# Patient Record
Sex: Female | Born: 1970 | Race: Black or African American | Hispanic: No | Marital: Married | Smoking: Never smoker
Health system: Southern US, Community
[De-identification: ages and names within clinical notes are randomized; demographics above are authoritative.]

## PROBLEM LIST (undated history)

## (undated) DIAGNOSIS — H409 Unspecified glaucoma: Secondary | ICD-10-CM

## (undated) DIAGNOSIS — H9192 Unspecified hearing loss, left ear: Secondary | ICD-10-CM

## (undated) HISTORY — PX: CHOLECYSTECTOMY: SHX55

## (undated) HISTORY — PX: OTHER SURGICAL HISTORY: SHX169

## (undated) HISTORY — DX: Unspecified glaucoma: H40.9

## (undated) HISTORY — DX: Unspecified hearing loss, left ear: H91.92

---

## 2021-12-31 ENCOUNTER — Other Ambulatory Visit: Payer: Self-pay | Admitting: Family Medicine

## 2021-12-31 DIAGNOSIS — Z1231 Encounter for screening mammogram for malignant neoplasm of breast: Secondary | ICD-10-CM

## 2022-02-11 ENCOUNTER — Ambulatory Visit
Admission: RE | Admit: 2022-02-11 | Discharge: 2022-02-11 | Disposition: A | Discharge status: Home / Self Care | Payer: 59 | Source: Ambulatory Visit | Attending: Family Medicine | Admitting: Family Medicine

## 2022-02-11 DIAGNOSIS — Z1231 Encounter for screening mammogram for malignant neoplasm of breast: Secondary | ICD-10-CM

## 2022-05-03 ENCOUNTER — Emergency Department (HOSPITAL_BASED_OUTPATIENT_CLINIC_OR_DEPARTMENT_OTHER)
Admission: EM | Admit: 2022-05-03 | Discharge: 2022-05-03 | Disposition: A | Discharge status: Home / Self Care | Payer: BC Managed Care – PPO | Attending: Emergency Medicine | Admitting: Emergency Medicine

## 2022-05-03 ENCOUNTER — Other Ambulatory Visit: Payer: Self-pay

## 2022-05-03 ENCOUNTER — Emergency Department (HOSPITAL_BASED_OUTPATIENT_CLINIC_OR_DEPARTMENT_OTHER): Payer: BC Managed Care – PPO

## 2022-05-03 DIAGNOSIS — R55 Syncope and collapse: Secondary | ICD-10-CM | POA: Insufficient documentation

## 2022-05-03 DIAGNOSIS — R2990 Unspecified symptoms and signs involving the nervous system: Secondary | ICD-10-CM | POA: Diagnosis not present

## 2022-05-03 DIAGNOSIS — G43109 Migraine with aura, not intractable, without status migrainosus: Secondary | ICD-10-CM

## 2022-05-03 DIAGNOSIS — G43909 Migraine, unspecified, not intractable, without status migrainosus: Secondary | ICD-10-CM | POA: Diagnosis present

## 2022-05-03 LAB — DIFFERENTIAL
Abs Immature Granulocytes: 0.02 10*3/uL (ref 0.00–0.07)
Basophils Absolute: 0 10*3/uL (ref 0.0–0.1)
Basophils Relative: 1 %
Eosinophils Absolute: 0.1 10*3/uL (ref 0.0–0.5)
Eosinophils Relative: 1 %
Immature Granulocytes: 0 %
Lymphocytes Relative: 45 %
Lymphs Abs: 3.2 10*3/uL (ref 0.7–4.0)
Monocytes Absolute: 0.4 10*3/uL (ref 0.1–1.0)
Monocytes Relative: 6 %
Neutro Abs: 3.3 10*3/uL (ref 1.7–7.7)
Neutrophils Relative %: 47 %

## 2022-05-03 LAB — COMPREHENSIVE METABOLIC PANEL
ALT: 12 U/L (ref 0–44)
AST: 28 U/L (ref 15–41)
Albumin: 4.6 g/dL (ref 3.5–5.0)
Alkaline Phosphatase: 68 U/L (ref 38–126)
Anion gap: 9 (ref 5–15)
BUN: 12 mg/dL (ref 6–20)
CO2: 24 mmol/L (ref 22–32)
Calcium: 9.9 mg/dL (ref 8.9–10.3)
Chloride: 106 mmol/L (ref 98–111)
Creatinine, Ser: 0.68 mg/dL (ref 0.44–1.00)
GFR, Estimated: >60 mL/min (ref >60)
Glucose, Bld: 85 mg/dL (ref 70–99)
Potassium: 4.7 mmol/L (ref 3.5–5.1)
Sodium: 139 mmol/L (ref 135–145)
Total Bilirubin: 0.4 mg/dL (ref 0.3–1.2)
Total Protein: 8.1 g/dL (ref 6.5–8.1)

## 2022-05-03 LAB — CBC
HCT: 41.9 % (ref 36.0–46.0)
Hemoglobin: 13.4 g/dL (ref 12.0–15.0)
MCH: 28.9 pg (ref 26.0–34.0)
MCHC: 32 g/dL (ref 30.0–36.0)
MCV: 90.3 fL (ref 80.0–100.0)
Platelets: 239 10*3/uL (ref 150–400)
RBC: 4.64 MIL/uL (ref 3.87–5.11)
RDW: 13.2 % (ref 11.5–15.5)
WBC: 7.1 10*3/uL (ref 4.0–10.5)
nRBC: 0 % (ref 0.0–0.2)

## 2022-05-03 LAB — ETHANOL: Alcohol, Ethyl (B): 15 mg/dL — ABNORMAL HIGH (ref <10)

## 2022-05-03 LAB — APTT: aPTT: 35 seconds (ref 24–36)

## 2022-05-03 LAB — PROTIME-INR
INR: 1 (ref 0.8–1.2)
Prothrombin Time: 12.8 seconds (ref 11.4–15.2)

## 2022-05-03 LAB — CBG MONITORING, ED
Glucose-Capillary: 52 mg/dL — ABNORMAL LOW (ref 70–99)
Glucose-Capillary: 131 mg/dL — ABNORMAL HIGH (ref 70–99)

## 2022-05-03 MED ORDER — SODIUM CHLORIDE 0.9 % IV SOLN: INTRAVENOUS | Status: DC

## 2022-05-03 MED ORDER — KETOROLAC TROMETHAMINE 15 MG/ML IJ SOLN
15.0000 mg | Freq: Four times a day (QID) | INTRAMUSCULAR | Status: DC
  Administered 2022-05-03: 15 mg via INTRAVENOUS
  Filled 2022-05-03: qty 1

## 2022-05-03 MED ORDER — THIAMINE HCL 100 MG/ML IJ SOLN
100.0000 mg | Freq: Every day | INTRAMUSCULAR | Status: DC
  Administered 2022-05-03: 100 mg via INTRAVENOUS
  Filled 2022-05-03: qty 2

## 2022-05-03 MED ORDER — MAGNESIUM SULFATE 2 GM/50ML IV SOLN
2.0000 g | Freq: Once | INTRAVENOUS | Status: AC
Start: 2022-05-03 — End: 2022-05-03
  Administered 2022-05-03: 2 g via INTRAVENOUS
  Filled 2022-05-03: qty 50

## 2022-05-03 MED ORDER — METOCLOPRAMIDE HCL 5 MG/ML IJ SOLN: 10.0000 mg | Freq: Once | INTRAMUSCULAR | Status: DC

## 2022-05-03 MED ORDER — DEXTROSE 50 % IV SOLN
INTRAVENOUS | Status: AC
  Administered 2022-05-03: 50 mL via INTRAVENOUS
  Filled 2022-05-03: qty 50

## 2022-05-03 MED ORDER — PROCHLORPERAZINE EDISYLATE 10 MG/2ML IJ SOLN
5.0000 mg | Freq: Four times a day (QID) | INTRAMUSCULAR | Status: DC
  Administered 2022-05-03: 5 mg via INTRAVENOUS
  Filled 2022-05-03: qty 2

## 2022-05-03 MED ORDER — SODIUM CHLORIDE 0.9 % IV BOLUS
1000.0000 mL | Freq: Once | INTRAVENOUS | Status: AC
  Administered 2022-05-03: 1000 mL via INTRAVENOUS

## 2022-05-03 MED ORDER — DEXAMETHASONE SODIUM PHOSPHATE 10 MG/ML IJ SOLN: 10.0000 mg | Freq: Once | INTRAMUSCULAR | Status: DC

## 2022-05-03 MED ORDER — DIPHENHYDRAMINE HCL 50 MG/ML IJ SOLN: 25.0000 mg | Freq: Once | INTRAMUSCULAR | Status: DC

## 2022-05-03 MED ORDER — DEXTROSE 50 % IV SOLN
1.0000 | Freq: Once | INTRAVENOUS | Status: AC
Start: 2022-05-03 — End: 2022-05-03

## 2022-05-03 MED ORDER — DIPHENHYDRAMINE HCL 50 MG/ML IJ SOLN
12.5000 mg | Freq: Four times a day (QID) | INTRAMUSCULAR | Status: DC
  Administered 2022-05-03: 12.5 mg via INTRAVENOUS
  Filled 2022-05-03: qty 1

## 2022-05-03 NOTE — ED Notes (Signed)
Pt became more alert after AMP of d-50 given

## 2022-05-03 NOTE — ED Notes (Signed)
Code stroke called by Dr. Deretha Emory, Telestroke notified

## 2022-05-04 LAB — VITAMIN B12: Vitamin B-12: 1462 pg/mL — ABNORMAL HIGH (ref 180–914)

## 2022-05-06 LAB — VITAMIN B1: Vitamin B1 (Thiamine): 71.4 nmol/L (ref 66.5–200.0)

## 2023-02-28 ENCOUNTER — Telehealth: Payer: Self-pay | Admitting: Neurology

## 2023-02-28 ENCOUNTER — Encounter: Payer: Self-pay | Admitting: Neurology

## 2023-02-28 ENCOUNTER — Ambulatory Visit: Payer: No Typology Code available for payment source | Admitting: Neurology

## 2023-02-28 VITALS — BP 117/70 | HR 66 | Ht 67.0 in | Wt 175.8 lb

## 2023-02-28 DIAGNOSIS — H409 Unspecified glaucoma: Secondary | ICD-10-CM | POA: Insufficient documentation

## 2023-02-28 DIAGNOSIS — G441 Vascular headache, not elsewhere classified: Secondary | ICD-10-CM | POA: Diagnosis not present

## 2023-02-28 DIAGNOSIS — G932 Benign intracranial hypertension: Secondary | ICD-10-CM | POA: Insufficient documentation

## 2023-02-28 DIAGNOSIS — G43709 Chronic migraine without aura, not intractable, without status migrainosus: Secondary | ICD-10-CM | POA: Diagnosis not present

## 2023-02-28 DIAGNOSIS — H4710 Unspecified papilledema: Secondary | ICD-10-CM

## 2023-02-28 DIAGNOSIS — R519 Headache, unspecified: Secondary | ICD-10-CM

## 2023-02-28 DIAGNOSIS — G4734 Idiopathic sleep related nonobstructive alveolar hypoventilation: Secondary | ICD-10-CM

## 2023-02-28 DIAGNOSIS — H539 Unspecified visual disturbance: Secondary | ICD-10-CM

## 2023-02-28 DIAGNOSIS — R51 Headache with orthostatic component, not elsewhere classified: Secondary | ICD-10-CM

## 2023-02-28 DIAGNOSIS — H5713 Ocular pain, bilateral: Secondary | ICD-10-CM

## 2023-02-28 MED ORDER — UBRELVY 100 MG PO TABS: 100.0000 mg | ORAL_TABLET | ORAL | 0 refills | Status: AC | PRN

## 2023-02-28 MED ORDER — FREMANEZUMAB-VFRM 225 MG/1.5ML ~~LOC~~ SOSY: 225.0000 mg | PREFILLED_SYRINGE | Freq: Once | SUBCUTANEOUS | Status: AC

## 2023-02-28 MED ORDER — AJOVY 225 MG/1.5ML ~~LOC~~ SOAJ: 225.0000 mg | SUBCUTANEOUS | 11 refills | Status: DC

## 2023-02-28 NOTE — Telephone Encounter (Signed)
Please send ONO overnight for patient for nocturnal hypoxemia she is not on oxygen and call and check up in a week make sure they sent it thanks

## 2023-02-28 NOTE — Patient Instructions (Addendum)
MRI brain and orbits Lumbar puncture Stay of the topiramate until we get this completed  Treat for Migraines: Ajovy Oxygenation monitor overnight  Ubrogepant Tablets What is this medication? UBROGEPANT (ue BROE je pant) treats migraines. It works by blocking a substance in the body that causes migraines. It is not used to prevent migraines. This medicine may be used for other purposes; ask your health care provider or pharmacist if you have questions. COMMON BRAND NAME(S): Bernita Raisin What should I tell my care team before I take this medication? They need to know if you have any of these conditions: Kidney disease Liver disease An unusual or allergic reaction to ubrogepant, other medications, foods, dyes, or preservatives Pregnant or trying to get pregnant Breast-feeding How should I use this medication? Take this medication by mouth with a glass of water. Take it as directed on the prescription label. You can take it with or without food. If it upsets your stomach, take it with food. Keep taking it unless your care team tells you to stop. Talk to your care team about the use of this medication in children. Special care may be needed. Overdosage: If you think you have taken too much of this medicine contact a poison control center or emergency room at once. NOTE: This medicine is only for you. Do not share this medicine with others. What if I miss a dose? This does not apply. This medication is not for regular use. What may interact with this medication? Do not take this medication with any of the following: Adagrasib Ceritinib Certain antibiotics, such as chloramphenicol, clarithromycin, telithromycin Certain antivirals for HIV, such as atazanavir, cobicistat, darunavir, delavirdine, fosamprenavir, indinavir, ritonavir Certain medications for fungal infections, such as itraconazole, ketoconazole, posaconazole,  voriconazole Conivaptan Grapefruit Idelalisib Mifepristone Nefazodone Ribociclib This medication may also interact with the following: Carvedilol Certain medications for seizures, such as phenobarbital, phenytoin Ciprofloxacin Cyclosporine Eltrombopag Fluconazole Fluvoxamine Quinidine Rifampin St. John's wort Verapamil This list may not describe all possible interactions. Give your health care provider a list of all the medicines, herbs, non-prescription drugs, or dietary supplements you use. Also tell them if you smoke, drink alcohol, or use illegal drugs. Some items may interact with your medicine. What should I watch for while using this medication? Visit your care team for regular checks on your progress. Tell your care team if your symptoms do not start to get better or if they get worse. Your mouth may get dry. Chewing sugarless gum or sucking hard candy and drinking plenty of water may help. Contact your care team if the problem does not go away or is severe. What side effects may I notice from receiving this medication? Side effects that you should report to your care team as soon as possible: Allergic reactions--skin rash, itching, hives, swelling of the face, lips, tongue, or throat Side effects that usually do not require medical attention (report to your care team if they continue or are bothersome): Drowsiness Dry mouth Fatigue Nausea This list may not describe all possible side effects. Call your doctor for medical advice about side effects. You may report side effects to FDA at 1-800-FDA-1088. Where should I keep my medication? Keep out of the reach of children and pets. Store between 15 and 30 degrees C (59 and 86 degrees F). Get rid of any unused medication after the expiration date. To get rid of medications that are no longer needed or have expired: Take the medication to a medication take-back program. Check with your pharmacy  or law enforcement to find a  location. If you cannot return the medication, check the label or package insert to see if the medication should be thrown out in the garbage or flushed down the toilet. If you are not sure, ask your care team. If it is safe to put it in the trash, pour the medication out of the container. Mix the medication with cat litter, dirt, coffee grounds, or other unwanted substance. Seal the mixture in a bag or container. Put it in the trash. NOTE: This sheet is a summary. It may not cover all possible information. If you have questions about this medicine, talk to your doctor, pharmacist, or health care provider.  2023 Elsevier/Gold Standard (2021-12-02 00:00:00)   Jill Browning Injection What is this medication? FREMANEZUMAB (fre ma NEZ ue mab) prevents migraines. It works by blocking a substance in the body that causes migraines. It is a monoclonal antibody. This medicine may be used for other purposes; ask your health care provider or pharmacist if you have questions. COMMON BRAND NAME(S): AJOVY What should I tell my care team before I take this medication? They need to know if you have any of these conditions: An unusual or allergic reaction to fremanezumab, other medications, foods, dyes, or preservatives Pregnant or trying to get pregnant Breast-feeding How should I use this medication? This medication is injected under the skin. You will be taught how to prepare and give it. Take it as directed on the prescription label. Keep taking it unless your care team tells you to stop. It is important that you put your used needles and syringes in a special sharps container. Do not put them in a trash can. If you do not have a sharps container, call your pharmacist or care team to get one. Talk to your care team about the use of this medication in children. Special care may be needed. Overdosage: If you think you have taken too much of this medicine contact a poison control center or emergency room at  once. NOTE: This medicine is only for you. Do not share this medicine with others. What if I miss a dose? If you miss a dose, take it as soon as you can. If it is almost time for your next dose, take only that dose. Do not take double or extra doses. What may interact with this medication? Interactions are not expected. This list may not describe all possible interactions. Give your health care provider a list of all the medicines, herbs, non-prescription drugs, or dietary supplements you use. Also tell them if you smoke, drink alcohol, or use illegal drugs. Some items may interact with your medicine. What should I watch for while using this medication? Tell your care team if your symptoms do not start to get better or if they get worse. What side effects may I notice from receiving this medication? Side effects that you should report to your care team as soon as possible: Allergic reactions or angioedema--skin rash, itching or hives, swelling of the face, eyes, lips, tongue, arms, or legs, trouble swallowing or breathing Side effects that usually do not require medical attention (report to your care team if they continue or are bothersome): Pain, redness, or irritation at injection site This list may not describe all possible side effects. Call your doctor for medical advice about side effects. You may report side effects to FDA at 1-800-FDA-1088. Where should I keep my medication? Keep out of the reach of children and pets. Store in a  refrigerator or at room temperature between 20 and 25 degrees C (68 and 77 degrees F). Refrigeration (preferred): Store in the refrigerator. Do not freeze. Keep in the original container until you are ready to take it. Remove the dose from the carton about 30 minutes before it is time for you to use it. If the dose is not used, it may be stored in the original container at room temperature for 7 days. Get rid of any unused medication after the expiration date. Room  Temperature: This medication may be stored at room temperature for up to 7 days. Keep it in the original container. Protect from light until time of use. If it is stored at room temperature, get rid of any unused medication after 7 days or after it expires, whichever is first. To get rid of medications that are no longer needed or have expired: Take the medication to a medication take-back program. Check with your pharmacy or law enforcement to find a location. If you cannot return the medication, ask your pharmacist or care team how to get rid of this medication safely. NOTE: This sheet is a summary. It may not cover all possible information. If you have questions about this medicine, talk to your doctor, pharmacist, or health care provider.  2023 Elsevier/Gold Standard (2021-12-14 00:00:00)

## 2023-02-28 NOTE — Telephone Encounter (Signed)
[employer] sent to [facility] they obtain auth [id number] 

## 2023-02-28 NOTE — Telephone Encounter (Signed)
Call to patient to advise that [facility] will be reaching out for ONO (overnight oxygen study). Explained what study would entail and patient in agreement and appreciative of call. Gave patient [facility] phone number and location and advised if she didn't hear from them in 1 week to call.

## 2023-02-28 NOTE — Progress Notes (Signed)
[facility]    Provider:  Dr Lucia Gaskins Requesting Provider: Caffie Damme, MD Primary Care Provider:  Caffie Damme, MD  CC:  migraines  HPI:  Jill Browning is a 52 y.o. female here as requested by Jill Damme, MD for pseudotumor cerebri.  Dr. Caffie Damme, MD from [facility].  I reviewed Dr. Michaelle Browning notes: Patient has a history of migraines, symptoms include headache, photophobia, phonophobia, nausea, and vomiting, no prodrome or aura or scotoma.  The headache is located in the left occipital area and right occipital area and the patient describes the pain as a viselike occurring daily, no new changes she was going to a neurologist and asked to be referred to a different.  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 LP in 02/25/2014 Opening pressure 17 cm water. Closing pressure 14 cm water with a "normal" MRV MRI MRI brain showed "Expanded sella and no pituitary mass. Optic nerve sheath complex is distended and tortuous on both sides. 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 "(report on "Care Everywhere). 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  Back in [place] dxed with glaucoma and was on acetazolamide. She sees dr. Prince Browning in [place] in [facility]. She was on Diamox for years prior to coming here from [place] and was changed by [facility] to Topiramate. Since she is here they are "intense" She has severe headaches, in the back of the head, not behind  the eyes, she has head pain that wakes her up at night, she wakes up with them in the morning, she doesn't snore, not excessively tired, no signs or symptoms of OSA but wakes up at least 3x out of the week. 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 Continuously daily headaches 24x7, migraines are at least 15 migraine days a month that moderate to severe can last all day. Topiramate was helping but not a whole lot. Not on it now. No other focal neurologic deficits, associated symptoms, inciting events or modifiable factors.   Reviewed notes, labs and imaging from outside physicians, which showed:  04/2022: [facility]" Patient ID: Jill Browning is a 52 y.o. female.  Presents for evaluation of headaches. Reports diagnosis of migraines in 2014 - reports chronic headaches, start posteriorly and radiate to the forehead, usually unilateral. Can see something moving beforehand, like squiggles in vision. Accompanied by blurred vision, neck pain, light and smell sensitivity. Worse with lying down (sleeps with head of bed elevated) as well as bending and straining. Reports since moving from [place], has increased to about 3 days a week - can last 1-2 hours. Takes tylenol without much benefit (not daily). 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 Was on acetazolamide for a while without significant benefit. "  EKG on December 09, 2022 showed a heart rate of 67  and a normal QT C of 422, sinus rhythm within normal limits, metabolic panel was normal including BUN 6 and creatinine is 0.61 CBC was also normal, hemoglobin A1c 5.1, HIV 1 and 2 nonreactive, thyroid was normal at 0.561, she is being referred to [facility] cardiology for her shortness of  breath, and coming here for her migraines and pseudotumor cerebri, she was started on pre-Maren vaginal cream recently for atrophic vaginitis.  Also see above for prior history from review of chart and referral.  Her magnesium is normal 2.1  There are also notes from [facility] from April 2022, it appears as though she has tried amitriptyline, Maxalt, Topamax, acetazolamide, Imitrex  CT head 05/03/2022: TECHNIQUE: Contiguous axial images were obtained from the base of the skull through the vertex without intravenous contrast.   RADIATION DOSE REDUCTION: This exam was performed according to the departmental dose-optimization program which includes automated exposure control, adjustment of the mA and/or kV according to patient size and/or use of iterative reconstruction technique.   COMPARISON:  None Available.   FINDINGS: Brain: Cerebral volume within normal limits for patient age.   No evidence for acute intracranial hemorrhage. No findings to suggest acute large vessel territory infarct. No mass lesion, midline shift, or mass effect. Ventricles are normal in size without evidence for hydrocephalus. No extra-axial fluid collection identified. Markedly empty sella.   Vascular: No hyperdense vessel identified.   Skull: Scalp soft tissues demonstrate no acute abnormality. Calvarium intact.   Sinuses/Orbits: Globes and orbital soft tissues within normal limits.   Visualized paranasal sinuses are clear. No mastoid effusion.   ASPECTS [facility] Stroke Program Early CT Score)   - Ganglionic level infarction (caudate, lentiform nuclei, internal capsule, insula, M1-M3 cortex): 7   - Supraganglionic infarction (M4-M6 cortex): 3   Total score (0-10 with 10 being normal): 10   IMPRESSION: 1. No acute intracranial abnormality. 2. ASPECTS is 10. 3. Markedly empty sella. While this finding can be incidental in nature and of no significance, this can also be seen in the  setting of idiopathic intracranial hypertension.  LP 2015: RESULT:  After explanation of risks, benefits, and alternatives, the patient was consented for the procedure and signed the consent form. The patient was then placed on the fluoroscopy  table in a prone, slightly obliqued position and the L3-L4 interspace was identified and marked. The site was then prepped and draped in a sterile fashion and 1% lidocaine was used  to anesthetize the site. A 5 inch 22-gauge spinal needle with stylet was introduced into the thecal sac and 10 mL of colorless, transparent fluid was collected and sent for further  laboratory analysis. Opening pressure was calculated at 17 cm water ( manometer reading of five on May 12 cm needle). Closing pressure was calculated at 14 cm water (manometer  reading of two on a 12 cm needle). The patient tolerated the procedure well and there were no initial complications. The patient was instructed to lay supine for 4 hours  immediately after the procedure.   IMPRESSION:  Successful fluoroscopically guided lumbar puncture. 10 mL of clear CSF fluid were obtained and sent for further laboratory review. Opening pressure 17 cm water. Closing pressure 14  cm water.   Review of Systems: Patient complains of symptoms per HPI as well as the following symptoms headache. Pertinent negatives and positives per HPI. All others negative.   Social History   Socioeconomic History   Marital status: Married    Spouse name: Not on  file   Number of children: Not on file   Years of education: Not on file   Highest education level: Not on file  Occupational History   Not on file  Tobacco Use   Smoking status: Never   Smokeless tobacco: Never  Substance and Sexual Activity   Alcohol use: Never   Drug use: Not on file   Sexual activity: Not on file  Other Topics Concern   Not on file  Social History Narrative   Not on file   Social Determinants of Health   Financial Resource  Strain: Not on file  Food Insecurity: Not on file  Transportation Needs: Not on file  Physical Activity: Not on file  Stress: Not on file  Social Connections: Not on file  Intimate Partner Violence: Not on file    Family History  Problem Relation Age of Onset   Migraines Neg Hx     Past Medical History:  Diagnosis Date   Deaf, left    Glaucoma     Patient Active Problem List   Diagnosis Date Noted   Chronic migraine without aura without status migrainosus, not intractable 02/28/2023   Glaucoma of both eyes 02/28/2023   History of IIH (idiopathic intracranial hypertension) 02/28/2023    Past Surgical History:  Procedure Laterality Date   CHOLECYSTECTOMY     gastric sleeve     hysterectomy     tubligation       Current Outpatient Medications  Medication Sig Dispense Refill   Acetaminophen (TYLENOL PO) Take by mouth.     Ascorbic Acid (VITAMIN C PO) Take by mouth.     brimonidine (ALPHAGAN) 0.2 % ophthalmic solution 1 drop 2 (two) times daily.     CALCIUM PO Take by mouth.     Fremanezumab-vfrm (AJOVY) 225 MG/1.5ML SOAJ Inject 225 mg into the skin every 30 (thirty) days. COPAY: BIN#: [id number] PCN#: PDMI GROUP#: [id number] ID#: [id number] EXPIRES: 11/07/2023 1.5 mL 11   latanoprost (XALATAN) 0.005 % ophthalmic solution Apply to eye.     Multiple Vitamin (MULTIVITAMIN PO) Take by mouth.     pantoprazole (PROTONIX) 40 MG tablet Take 40 mg by mouth daily.     timolol (TIMOPTIC) 0.5 % ophthalmic solution Apply to eye.     Ubrogepant (UBRELVY) 100 MG TABS Take 1 tablet (100 mg total) by mouth every 2 (two) hours as needed. Maximum 200mg  a day. 8 tablet 0   VITAMIN D PO Take by mouth.     Current Facility-Administered Medications  Medication Dose Route Frequency Provider Last Rate Last Admin   Fremanezumab-vfrm SOSY 225 mg  225 mg Subcutaneous Once Anson Fret, MD        Allergies as of 02/28/2023 - Review Complete 02/28/2023  Allergen Reaction Noted   Oxycodone  Anaphylaxis 05/03/2022    Vitals: BP 117/70   Pulse 66   Ht 5\' 7"  (1.702 m)   Wt 175 lb 12.8 oz (79.7 kg)   BMI 27.53 kg/m  Last Weight:  Wt Readings from Last 1 Encounters:  02/28/23 175 lb 12.8 oz (79.7 kg)   Last Height:   Ht Readings from Last 1 Encounters:  02/28/23 5\' 7"  (1.702 m)     Physical exam: Exam: Gen: NAD, conversant, well nourised, obese, well groomed                     CV: RRR, no MRG. No Carotid Bruits. No peripheral edema, warm, nontender Eyes: Conjunctivae clear without exudates or  hemorrhage  Neuro: Detailed Neurologic Exam  Speech:    Speech is normal; fluent and spontaneous with normal comprehension.  Cognition:    The patient is oriented to person, place, and time;     recent and remote memory intact;     language fluent;     normal attention, concentration,     fund of knowledge Cranial Nerves:    The pupils are equal, round, and reactive to light. ONH elevation? Visual fields are full to finger confrontation. Extraocular movements are intact. Trigeminal sensation is intact and the muscles of mastication are normal. The face is symmetric. The palate elevates in the midline. 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 Voice is normal. Shoulder shrug is normal. The tongue has normal motion without fasciculations.   Coordination:    Normal finger to nose and heel to shin. Normal rapid alternating movements.   Gait:    Heel-toe and tandem gait are normal.   Motor Observation:    No asymmetry, no atrophy, and no involuntary movements noted. Tone:    Normal muscle tone.    Posture:    Posture is normal. normal erect    Strength:    Strength is V/V in the upper and lower limbs.      Sensation: intact to LT     Reflex Exam:  DTR's:    Deep tendon reflexes in the upper and lower extremities are normal bilaterally.   Toes:    The toes are downgoing bilaterally.   Clonus:    Clonus is absent.    Assessment/Plan:  Patient with chronic migraine.  Was worked up in [place] and diagnosed with IDIOPATHIC INTRACRANIAL HYPERTENSION due to , MRI brain showed "Expanded sella and no pituitary mass. Optic nerve sheath complex is distended and tortuous on both sides. Findings are suggestive of idiopathic intracranial hypertension" al though reports I see from there show opening pressure of 17cm, and no obvious field cuts or papilledema. Was on acetazolamide for a while without significant benefit. But she was in the hospital for 3-4 days and had 3 LPs per patient so maybe we don;t have the whole story.  MRI brain and orbits: continuous headaches, vision changes, positional headaches, needs thorough eval Lumbar puncture Stay off the topiramate until we get this completed  Treat for Migraines: Ajovy Oxygenation monitor overnight ordered for morning headaches and possibly hypoxemia overnight then may need pulm or sleep study  Orders Placed This Encounter  Procedures   MR BRAIN W WO CONTRAST   DG FLUORO GUIDED LOC OF NEEDLE/CATH TIP FOR SPINAL INJECT LT   MR ORBITS W WO CONTRAST   Meds ordered this encounter  Medications   Ubrogepant (UBRELVY) 100 MG TABS    Sig: Take 1 tablet (100 mg total) by mouth every 2 (two) hours as needed. Maximum  a day.    Dispense:  8 tablet    Refill:  0    [id number] 07/2024   Fremanezumab-vfrm SOSY 225 mg    2026-5 [id number]   Fremanezumab-vfrm (AJOVY) 225 MG/1.5ML SOAJ    Sig: Inject 225 mg into the skin every 30 (thirty) days. COPAY: BIN#: [id number] PCN#: PDMI GROUP#: [id number] ID#: [id number] EXPIRES: 11/07/2023    Dispense:  1.5 mL    Refill:  11    RUN COPAY CARD COPAY: BIN#: [id number] PCN#: PDMI GROUP#: [id number] ID#: [id number] EXPIRES: 11/07/2023    Cc: Jill Damme, MD,  Jill Damme, MD  Naomie Dean, MD  [facility] [phone]  [place], [place] [address]  Phone [phone] Fax [phone]

## 2023-02-28 NOTE — Addendum Note (Signed)
Addended by: Lenn Cal on: 02/28/2023 11:33 AM   Modules accepted: Orders

## 2023-03-03 ENCOUNTER — Other Ambulatory Visit (HOSPITAL_COMMUNITY): Payer: Self-pay | Admitting: Family Medicine

## 2023-03-03 DIAGNOSIS — R072 Precordial pain: Secondary | ICD-10-CM

## 2023-03-06 ENCOUNTER — Encounter: Payer: Self-pay | Admitting: Neurology

## 2023-03-07 ENCOUNTER — Encounter (HOSPITAL_COMMUNITY): Payer: Self-pay

## 2023-03-07 ENCOUNTER — Encounter (HOSPITAL_COMMUNITY): Payer: Self-pay | Admitting: Emergency Medicine

## 2023-03-09 ENCOUNTER — Encounter: Payer: Self-pay | Admitting: Neurology

## 2023-03-09 NOTE — Telephone Encounter (Signed)
Received overnight pulse oximetry test results for Dr Lucia Gaskins to review. Some data on paper is unclear so I have faxed a request back to [employer] to please send the results again urgently to [phone].  Considerations noted on test: "It appears this patient qualifies for nocturnal oxygen  per medicare guidelines. Please inquire with respiratory company for coverage guidelines for group 1."

## 2023-03-16 NOTE — Telephone Encounter (Signed)
Called [facility]. They are going to send the results over to [phone] right now.

## 2023-03-22 ENCOUNTER — Telehealth (HOSPITAL_COMMUNITY): Payer: Self-pay | Admitting: *Deleted

## 2023-03-22 ENCOUNTER — Other Ambulatory Visit (HOSPITAL_COMMUNITY): Payer: Self-pay | Admitting: *Deleted

## 2023-03-22 MED ORDER — METOPROLOL TARTRATE 25 MG PO TABS: ORAL_TABLET | ORAL | 0 refills | Status: AC

## 2023-03-22 NOTE — Telephone Encounter (Signed)
Patient calling about upcoming cardiac imaging study; pt verbalizes understanding of appt date/time, parking situation and where to check in, pre-test NPO status and medications ordered, and verified current allergies; name and call back number provided for further questions should they arise  Larey Brick RN Navigator Cardiac Imaging [facility] [phone] office [phone] cell  Patient to take 25mg  metoprolol tartrate two hours prior to her cardiac CT scan. She is aware to arrive at 9:30am.

## 2023-03-24 ENCOUNTER — Ambulatory Visit (HOSPITAL_COMMUNITY)
Admission: RE | Admit: 2023-03-24 | Discharge: 2023-03-24 | Disposition: A | Discharge status: Home / Self Care | Payer: No Typology Code available for payment source | Source: Ambulatory Visit | Attending: Family Medicine | Admitting: Family Medicine

## 2023-03-24 DIAGNOSIS — R072 Precordial pain: Secondary | ICD-10-CM | POA: Diagnosis not present

## 2023-03-24 MED ORDER — NITROGLYCERIN 0.4 MG SL SUBL
0.8000 mg | SUBLINGUAL_TABLET | Freq: Once | SUBLINGUAL | Status: AC
  Administered 2023-03-24: 0.8 mg via SUBLINGUAL

## 2023-03-24 MED ORDER — IOHEXOL 350 MG/ML SOLN
100.0000 mL | Freq: Once | INTRAVENOUS | Status: AC | PRN
Start: 2023-03-24 — End: 2023-03-24
  Administered 2023-03-24: 100 mL via INTRAVENOUS

## 2023-03-24 MED ORDER — NITROGLYCERIN 0.4 MG SL SUBL
SUBLINGUAL_TABLET | SUBLINGUAL | Status: AC
  Filled 2023-03-24: qty 2

## 2023-03-24 NOTE — Progress Notes (Signed)
CT scan completed. Tolerated well. D/C home ambulatory with granddaughter. Awake and alert. I no distress

## 2023-03-31 ENCOUNTER — Ambulatory Visit
Admission: RE | Admit: 2023-03-31 | Discharge: 2023-03-31 | Disposition: A | Discharge status: Home / Self Care | Payer: No Typology Code available for payment source | Source: Ambulatory Visit | Attending: Neurology

## 2023-03-31 ENCOUNTER — Ambulatory Visit
Admission: RE | Admit: 2023-03-31 | Discharge: 2023-03-31 | Disposition: A | Discharge status: Home / Self Care | Payer: No Typology Code available for payment source | Source: Ambulatory Visit | Attending: Neurology | Admitting: Neurology

## 2023-03-31 DIAGNOSIS — R51 Headache with orthostatic component, not elsewhere classified: Secondary | ICD-10-CM

## 2023-03-31 DIAGNOSIS — G932 Benign intracranial hypertension: Secondary | ICD-10-CM

## 2023-03-31 DIAGNOSIS — H5713 Ocular pain, bilateral: Secondary | ICD-10-CM

## 2023-03-31 DIAGNOSIS — R519 Headache, unspecified: Secondary | ICD-10-CM

## 2023-03-31 DIAGNOSIS — H409 Unspecified glaucoma: Secondary | ICD-10-CM

## 2023-03-31 DIAGNOSIS — G441 Vascular headache, not elsewhere classified: Secondary | ICD-10-CM

## 2023-03-31 DIAGNOSIS — H539 Unspecified visual disturbance: Secondary | ICD-10-CM

## 2023-03-31 DIAGNOSIS — H4710 Unspecified papilledema: Secondary | ICD-10-CM

## 2023-03-31 MED ORDER — GADOPICLENOL 0.5 MMOL/ML IV SOLN
7.5000 mL | Freq: Once | INTRAVENOUS | Status: AC | PRN
  Administered 2023-03-31: 7.5 mL via INTRAVENOUS

## 2023-04-06 ENCOUNTER — Telehealth: Payer: Self-pay | Admitting: *Deleted

## 2023-04-06 NOTE — Progress Notes (Signed)
See note on MRI orbits.

## 2023-04-06 NOTE — Progress Notes (Signed)
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 Advise patient to proceed with LP.

## 2023-04-06 NOTE — Telephone Encounter (Signed)
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  These findings are consistent with potentially having increased pressure with spinal fluid so Dr. Teresa Coombs does advise that she proceed with the lumbar puncture so we can check the pressure.  I provided her with our office number in case she has any pertinent questions while our office is closed over the weekend and also provided her with [facility] imaging's phone number so she can give them a call as early as tomorrow to schedule the lumbar puncture.

## 2023-04-06 NOTE — Telephone Encounter (Signed)
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 Advise patient to proceed with LP.

## 2023-04-10 ENCOUNTER — Other Ambulatory Visit: Payer: Self-pay | Admitting: Family Medicine

## 2023-04-10 DIAGNOSIS — Z1231 Encounter for screening mammogram for malignant neoplasm of breast: Secondary | ICD-10-CM

## 2023-04-12 NOTE — Telephone Encounter (Signed)
I called the patient and LVM (ok per DPR) advising patient of results of MRI brain and orbits as noted below. I asked for patient to call us back and also encouraged her per provider to proceed with the lumbar puncture that was already ordered, so we can check her pressure. I also provided GI's number again so she can call and schedule LP.

## 2023-04-20 ENCOUNTER — Encounter: Payer: Self-pay | Admitting: *Deleted

## 2023-04-20 NOTE — Telephone Encounter (Addendum)
Letter mailed to patient.

## 2023-04-20 NOTE — Telephone Encounter (Signed)
Jill Browning w/ GI said she has tried to call the patient several times and left her a message for call back to schedule the LP but she hasn't heard back from the patient yet.   I called the patient again and LVM (OK per DPR) advising her that we have been trying to reach her, sent her a mychart message, and GI has tried to call the patient to schedule her lumbar puncture. I asked the pt to call us back and I provided the office number.   I called the pt's husband this time, Jill Browning (on DPR) and LVM (ok per DPR) advising we have tried several times to reach the patient to discuss results and recommendations to proceed with further testing. I expressed this matter is important and I asked for him to have the patient call us back. I left our office number in the message.

## 2023-04-24 ENCOUNTER — Encounter: Payer: Self-pay | Admitting: *Deleted

## 2023-04-24 NOTE — Telephone Encounter (Signed)
Error

## 2023-04-24 NOTE — Telephone Encounter (Signed)
LVM 04/24/2023  letter was mailed to pt on 04/20/2023  Jill Browning has left several VM

## 2023-04-24 NOTE — Telephone Encounter (Signed)
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 Advise patient to proceed with LP.

## 2023-04-24 NOTE — Telephone Encounter (Signed)
Spoke to patient gave mri results and gave her [facility] imaging # to return call to cathy  to set up appointment for LP Pt thanked me for calling

## 2023-04-24 NOTE — Addendum Note (Signed)
Encounter addended by: Hettie Holstein, CMA on: 04/24/2023 11:43 AM  Actions taken: Result note filed

## 2023-05-04 ENCOUNTER — Ambulatory Visit
Admission: RE | Admit: 2023-05-04 | Discharge: 2023-05-04 | Disposition: A | Discharge status: Home / Self Care | Payer: No Typology Code available for payment source | Source: Ambulatory Visit | Attending: Family Medicine | Admitting: Family Medicine

## 2023-05-04 DIAGNOSIS — Z1231 Encounter for screening mammogram for malignant neoplasm of breast: Secondary | ICD-10-CM

## 2023-05-31 ENCOUNTER — Telehealth: Payer: Self-pay | Admitting: *Deleted

## 2023-05-31 NOTE — Telephone Encounter (Signed)
Placed notes on Dr Trevor Mace desk for review.

## 2023-05-31 NOTE — Telephone Encounter (Signed)
R/C notes from [facility]. Notes in nurse pod.

## 2023-06-02 ENCOUNTER — Inpatient Hospital Stay: Admission: RE | Admit: 2023-06-02 | Payer: No Typology Code available for payment source | Source: Ambulatory Visit

## 2023-06-09 NOTE — Telephone Encounter (Signed)
Received message from Tom Bean @ GI. Pt was scheduled to have LP on 7/26 but she canceled and said she would r/s. Pt hasn't r/s yet. I asked them to reach out to her to attempt to reschedule.

## 2023-07-04 ENCOUNTER — Ambulatory Visit: Payer: No Typology Code available for payment source | Admitting: Neurology

## 2023-07-10 ENCOUNTER — Encounter (HOSPITAL_COMMUNITY): Payer: Self-pay

## 2023-07-10 ENCOUNTER — Other Ambulatory Visit: Payer: Self-pay

## 2023-07-10 ENCOUNTER — Emergency Department (HOSPITAL_COMMUNITY): Payer: No Typology Code available for payment source

## 2023-07-10 ENCOUNTER — Emergency Department (HOSPITAL_COMMUNITY)
Admission: EM | Admit: 2023-07-10 | Discharge: 2023-07-10 | Disposition: A | Discharge status: Home / Self Care | Payer: No Typology Code available for payment source | Attending: Emergency Medicine | Admitting: Emergency Medicine

## 2023-07-10 DIAGNOSIS — R519 Headache, unspecified: Secondary | ICD-10-CM | POA: Insufficient documentation

## 2023-07-10 DIAGNOSIS — Y92410 Unspecified street and highway as the place of occurrence of the external cause: Secondary | ICD-10-CM | POA: Diagnosis not present

## 2023-07-10 DIAGNOSIS — S8001XA Contusion of right knee, initial encounter: Secondary | ICD-10-CM | POA: Diagnosis present

## 2023-07-10 MED ORDER — DROPERIDOL 2.5 MG/ML IJ SOLN
2.5000 mg | Freq: Once | INTRAMUSCULAR | Status: AC
  Administered 2023-07-10: 2.5 mg via INTRAVENOUS
  Filled 2023-07-10: qty 2

## 2023-07-10 MED ORDER — KETOROLAC TROMETHAMINE 30 MG/ML IJ SOLN
30.0000 mg | Freq: Once | INTRAMUSCULAR | Status: AC
  Administered 2023-07-10: 30 mg via INTRAMUSCULAR
  Filled 2023-07-10: qty 1

## 2023-07-10 MED ORDER — ACETAMINOPHEN 500 MG PO TABS
1000.0000 mg | ORAL_TABLET | Freq: Once | ORAL | Status: AC
  Administered 2023-07-10: 1000 mg via ORAL
  Filled 2023-07-10: qty 2
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
## 2023-07-10 NOTE — ED Notes (Signed)
Patient transported to X-ray 

## 2023-07-10 NOTE — ED Notes (Signed)
Pt was difficult IV stick.  Requires ultrasound IV.  Attempted to look for access. But unsuccessful.
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
## 2023-07-19 IMAGING — MG MM DIGITAL SCREENING BILAT W/ TOMO AND CAD
8 series · 8 of 24 positions shown · non-contrast
Comparison: Previous exam(s).

CLINICAL DATA: Screening.

EXAM:
DIGITAL SCREENING BILATERAL MAMMOGRAM WITH TOMOSYNTHESIS AND CAD
TECHNIQUE: Bilateral screening digital craniocaudal and mediolateral oblique
mammograms were obtained. Bilateral screening digital breast
tomosynthesis was performed. The images were evaluated with
computer-aided detection.

[R CC synth-2D]
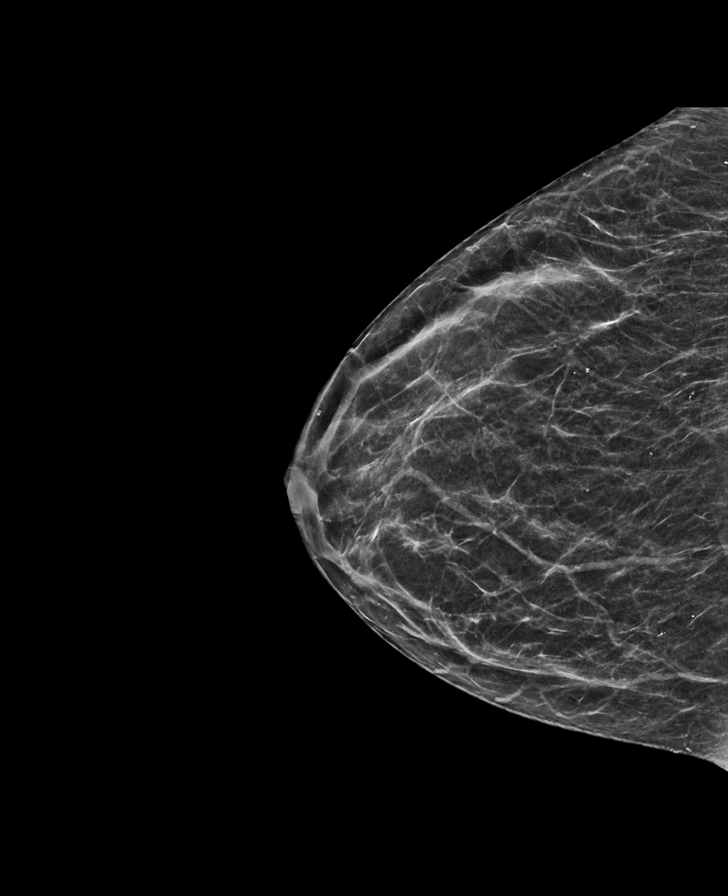

[R MLO synth-2D]
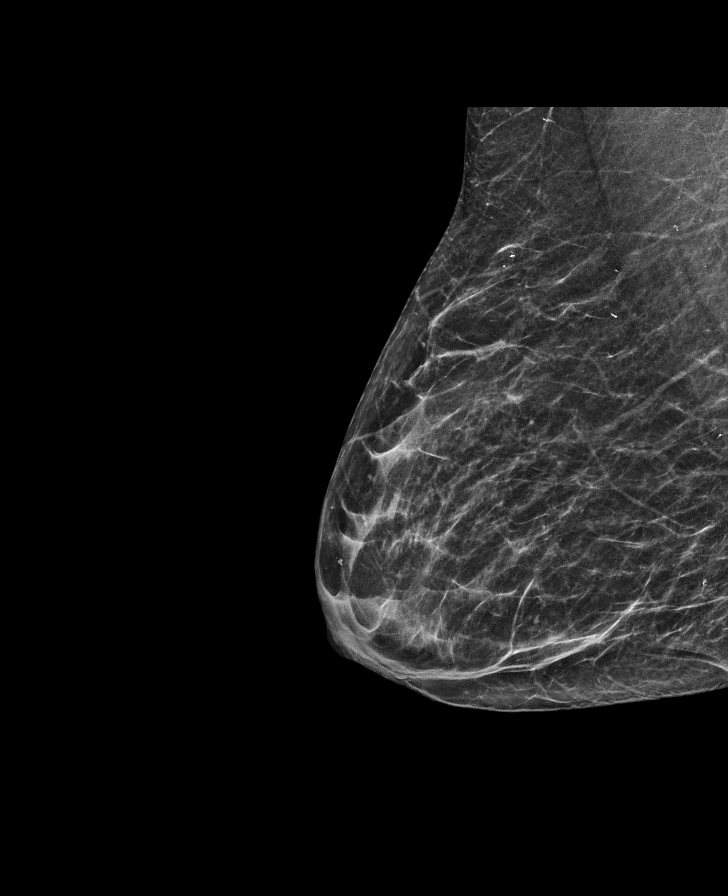

[L MLO synth-2D]
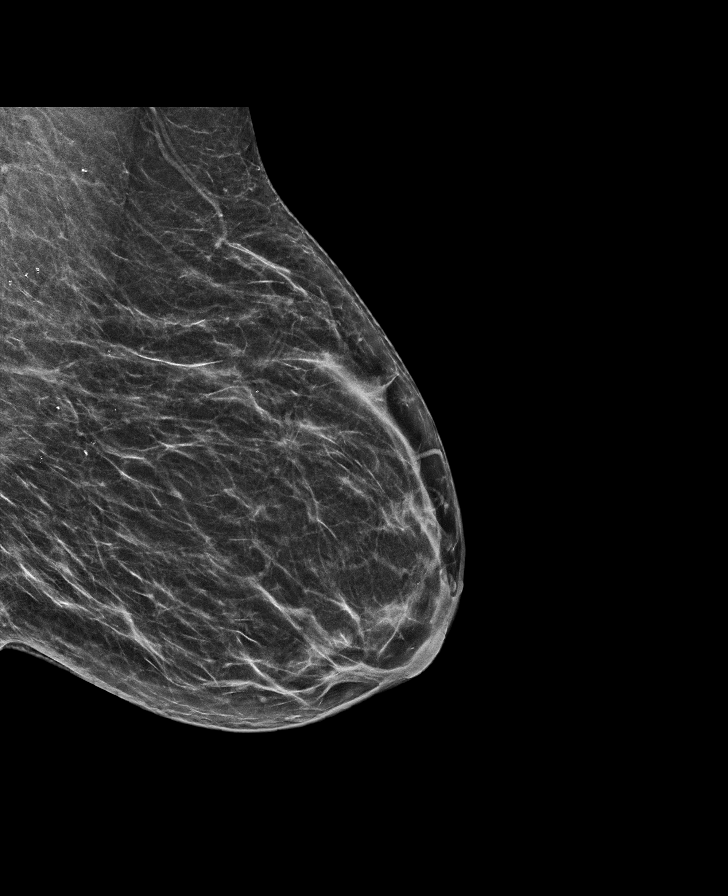

[L CC synth-2D]
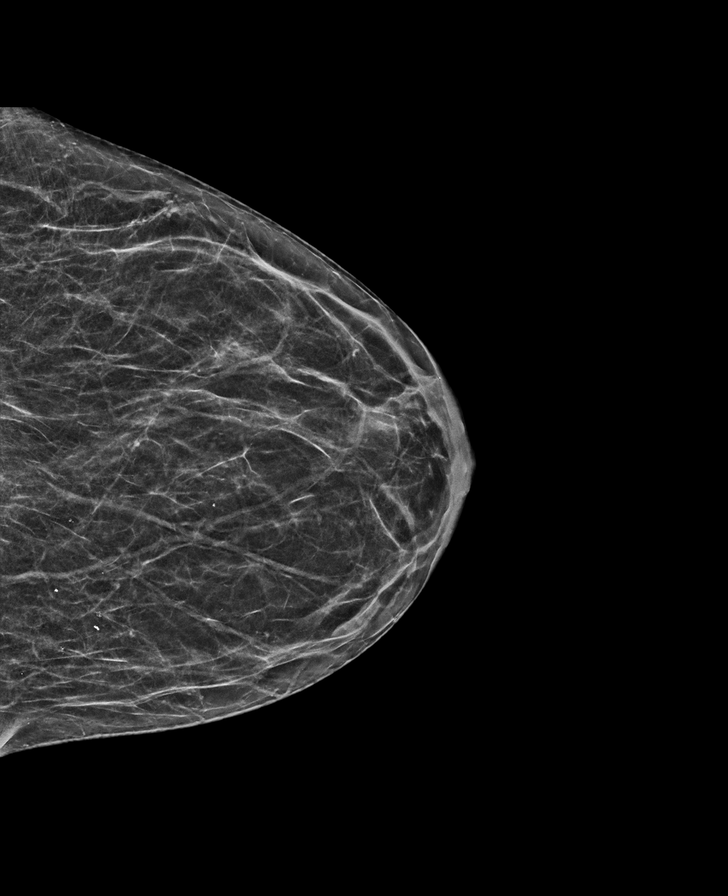

[R CC tomo · tomo slice 26/51.0]
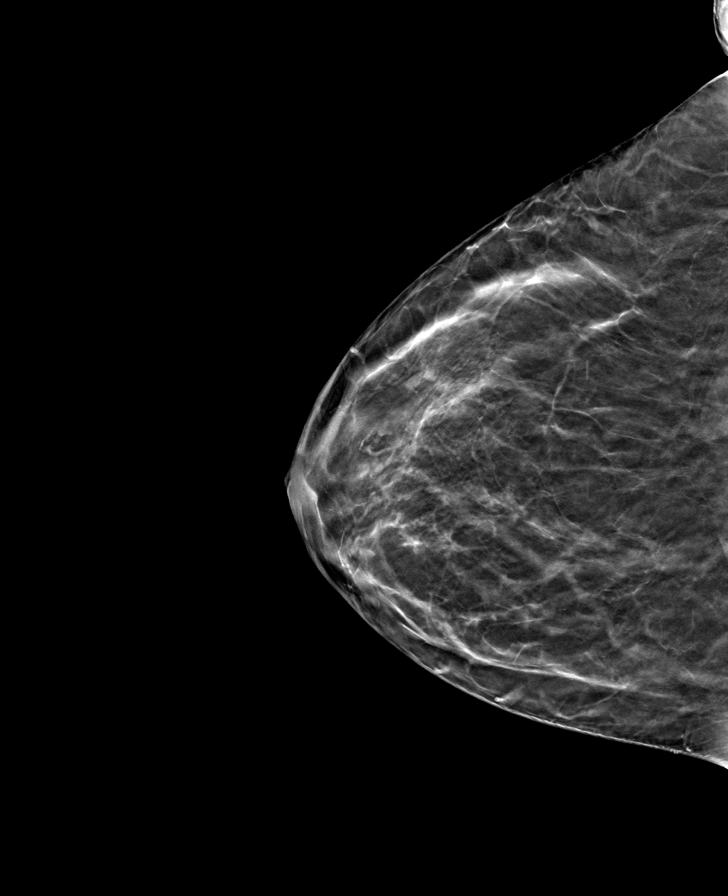

[L MLO tomo · tomo slice 29/58.0]
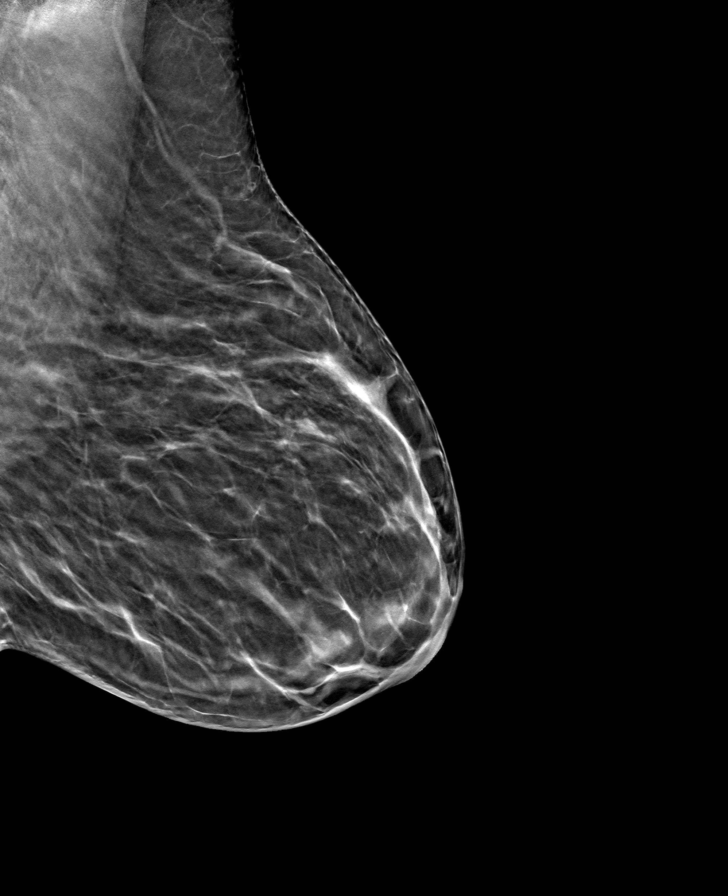

[L CC tomo · tomo slice 27/52.0]
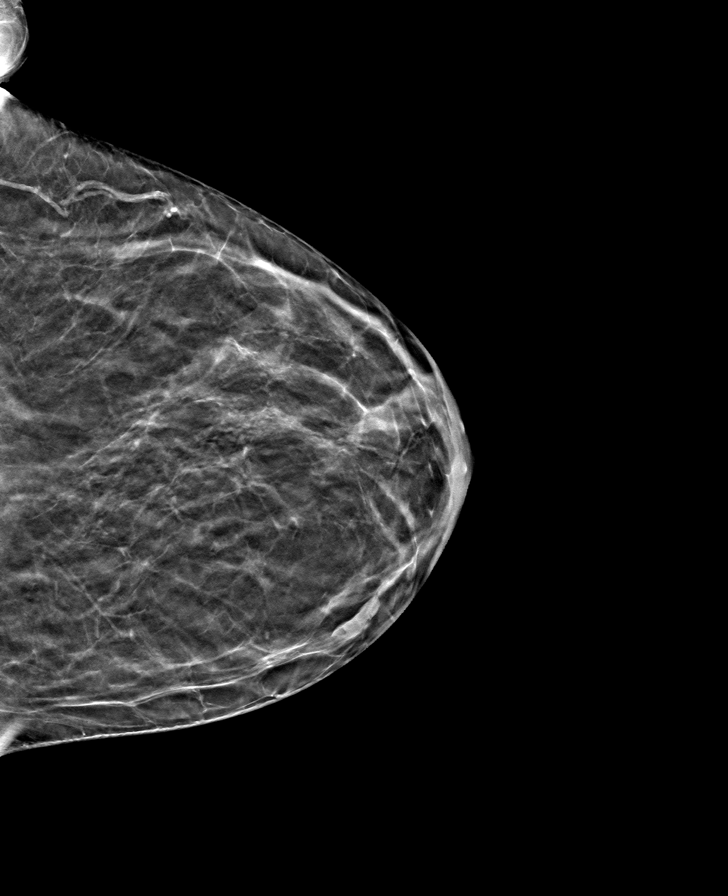

[R MLO tomo · tomo slice 29/56.0]
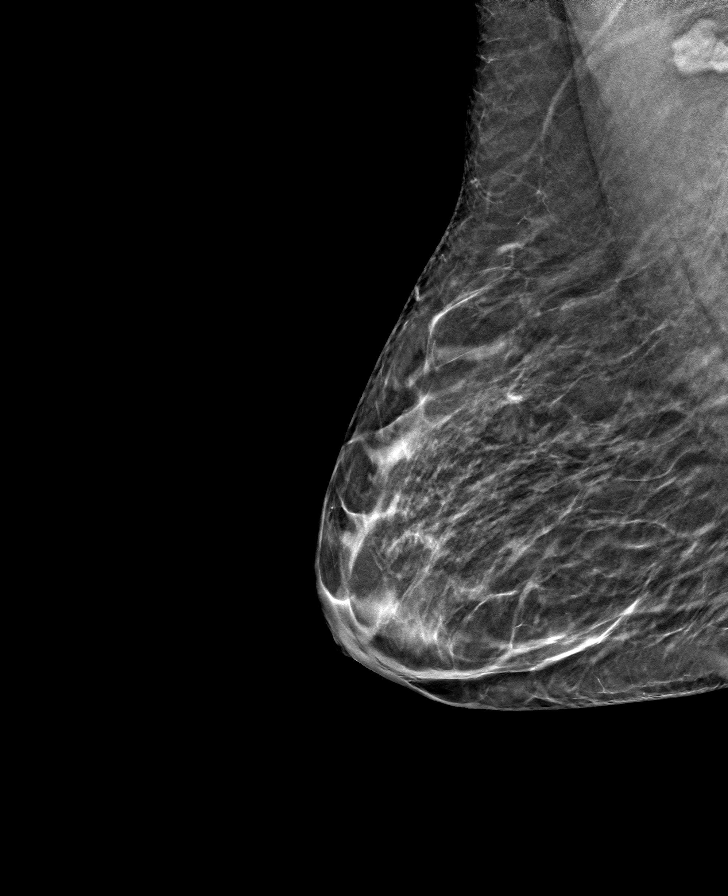

[8 of 24 positions shown; findings below may reference images not displayed]

ACR Breast Density Category b: There are scattered areas of
fibroglandular density.
FINDINGS: There are no findings suspicious for malignancy.
IMPRESSION: No mammographic evidence of malignancy. A result letter of this
screening mammogram will be mailed directly to the patient.

RECOMMENDATION:
Screening mammogram in one year. (Code:51-O-LD2)

BI-RADS CATEGORY  1: Negative.

## 2023-11-20 ENCOUNTER — Telehealth (INDEPENDENT_AMBULATORY_CARE_PROVIDER_SITE_OTHER): Payer: No Typology Code available for payment source | Admitting: Neurology

## 2023-11-20 ENCOUNTER — Encounter: Payer: Self-pay | Admitting: Neurology

## 2023-11-20 DIAGNOSIS — G932 Benign intracranial hypertension: Secondary | ICD-10-CM

## 2023-11-20 DIAGNOSIS — G4734 Idiopathic sleep related nonobstructive alveolar hypoventilation: Secondary | ICD-10-CM | POA: Insufficient documentation

## 2023-11-20 DIAGNOSIS — G96810 Intracranial hypotension, unspecified: Secondary | ICD-10-CM | POA: Diagnosis not present

## 2023-11-20 DIAGNOSIS — G43709 Chronic migraine without aura, not intractable, without status migrainosus: Secondary | ICD-10-CM | POA: Diagnosis not present

## 2023-11-20 DIAGNOSIS — G43711 Chronic migraine without aura, intractable, with status migrainosus: Secondary | ICD-10-CM | POA: Insufficient documentation

## 2023-11-20 DIAGNOSIS — R519 Headache, unspecified: Secondary | ICD-10-CM

## 2023-11-20 MED ORDER — ACETAZOLAMIDE 250 MG PO TABS: 250.0000 mg | ORAL_TABLET | Freq: Two times a day (BID) | ORAL | 3 refills | Status: DC

## 2023-11-20 MED ORDER — AJOVY 225 MG/1.5ML ~~LOC~~ SOAJ
225.0000 mg | SUBCUTANEOUS | 11 refills | Status: AC
Start: 2023-11-20 — End: ?

## 2023-11-20 NOTE — Progress Notes (Signed)
 [facility]    Provider:  Dr Ines Requesting Provider: Claudene Round, MD Primary Care Provider:  Claudene Round, MD  CC:  migraines  Virtual Visit via Video Note  I connected with Jill Browning on 11/20/23 at  7:30 AM EST by a video enabled telemedicine application and verified that I am speaking with the correct person using two identifiers.  Location: Patient: home Provider: [place]   I discussed the limitations of evaluation and management by telemedicine and the availability of in person appointments. The patient expressed understanding and agreed to proceed.  Follow Up Instructions:    I discussed the assessment and treatment plan with the patient. The patient was provided an opportunity to ask questions and all were answered. The patient agreed with the plan and demonstrated an understanding of the instructions.   The patient was advised to call back or seek an in-person evaluation if the symptoms worsen or if the condition fails to improve as anticipated.  I provided over 30 minutes of non-face-to-face time during this encounter.   Jill Browning Ines, MD   11/20/2023: Patient here for follow up. MRI showed markedly enlarged sella turcica consistent with a empty sella. MRI orbits showed: The orbits are essentially normal. The optic nerve sheaths were mildly widened which can be incidental or due to elevated intracranial pressure. Saw her 02/28/2023. Per notes ONO showed Considerations noted on test: It appears this patient qualifies for nocturnal oxygen  per medicare guidelines. Please inquire with respiratory company for coverage guidelines for group and we advised her to get an LP.  Per notes, RN 03/2023: Donny w/ GI said she has tried to call the patient several times and left her a message for call back to schedule the LP but she hasn't heard back from the patient yet. I called the patient again and LVM (OK per DPR) advising her that we have been trying  to reach her, sent her a mychart message, and GI has tried to call the patient to schedule her lumbar puncture. I asked the pt to call us  back and I provided the office number. I called the pt's husband this time, Jill Browning (on DPR) and LVM (ok per DPR) advising we have tried several times to reach the patient to discuss results and recommendations to proceed with further testing. I expressed this matter is important and I asked for him to have the patient call us  back. I left our office number in the message. A letter was sent and she canceled her LP 05/2025.   Per patient Ajovy  is helping but she ran out of refills (I gave her 11 refills less than a year ago?). I will refill Ajovy  if helping her. She cannot get an Lp due to her job. We discussed we just will start her on Acetazolamide , I would highly recommend an LP but she can't get out of her job and she canceled her 7/26 Lp appointment and she has papilledema so will treat also will refer to sleep studies for desaturation on ONO and morning headaches. Headaches are the same. She is still having blurry vision. It has not worsened, stable, more floaters, following with [facility] in [place] next appointment in May last eye exam revealed slightly better papilledema, will get notes from them. Will try to get her up to acetazolamide  500mg  twice daily and told her the improtance of not losing her to follow up.   MRI: IMPRESSION: This MRI of the brain with and without contrast shows  the following: Few punctate T2/FLAIR hyperintense foci in the subcortical or deep white matter of the cerebral hemispheres.  This is a nonspecific finding and most likely represents either sequela of migraine headache or very minimal chronic microvascular ischemic changes.  None of the foci appear to be acute. The pituitary tissue is a thin rim within a markedly enlarged sella turcica consistent with a empty sella.  This appears unchanged compared to the CT scan from  05/03/2022. No acute findings.  Normal enhancement pattern.  HPI:  Jill Browning is a 53 y.o. female here as requested by Claudene Round, MD for pseudotumor cerebri.  Dr. Round Claudene, MD from [facility].  I reviewed Dr. Theressa notes: Patient has a history of migraines, symptoms include headache, photophobia, phonophobia, nausea, and vomiting, no prodrome or aura or scotoma.  The headache is located in the left occipital area and right occipital area and the patient describes the pain as a viselike occurring daily, no new changes she was going to a neurologist and asked to be referred to a different.  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 LP in 02/25/2014 Opening pressure 17 cm water. Closing pressure 14 cm water with a normal MRV MRI MRI brain showed Expanded sella and no pituitary mass. Optic nerve sheath complex is distended and tortuous on both sides. 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 (report on Care Everywhere). She saw [facility] in 2023 and was on topamax, diamox  did not help, reported IDIOPATHIC INTRACRANIAL HYPERTENSION but NO papilledema and opening pressure was 17.  Back in [place]  dxed with glaucoma and was on acetazolamide . She sees dr. ALONSO Sleeper in [place] in [facility]. She was on Diamox  for years prior to coming here from [place]  and was changed by [facility] to Topiramate. Since she is here they are intense She has severe headaches, in the back of the head, not behind the eyes, she has head pain that wakes her up at night, she wakes up with them in the morning, she  doesn't snore, not excessively tired, no signs or symptoms of OSA but wakes up at least 3x out of the week. 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 Continuously daily headaches 24x7, migraines are at least 15 migraine days a month that moderate to severe can last all day. Topiramate was helping but not a whole lot. Not on it now. No other focal neurologic deficits, associated symptoms, inciting events or modifiable factors.   Reviewed notes, labs and imaging from outside physicians, which showed:  04/2022: [facility] Patient ID: Amayah Staheli is a 53 y.o. female.  Presents for evaluation of headaches. Reports diagnosis of migraines in 2014 - reports chronic headaches, start posteriorly and radiate to the forehead, usually unilateral. Can see something moving beforehand, like squiggles in vision. Accompanied by blurred vision, neck pain, light and smell sensitivity. Worse with lying down (sleeps with head of bed elevated) as well as bending and straining. Reports since moving from [place] , has increased to about 3 days a week - can last 1-2 hours. Takes tylenol  without  much benefit (not daily). Was worked up in [place]  and diagnosed with IIH, due to partially empty sella on MRI, though reports I see from there show opening pressure of 17cm, and no obvious field cuts or papilledema. Was on acetazolamide  for a while without significant benefit.   EKG on December 09, 2022 showed a heart rate of 67 and a normal QT C of 422, sinus rhythm within normal limits, metabolic panel was normal including BUN 6 and creatinine is 0.61 CBC was also normal, hemoglobin A1c 5.1, HIV 1 and 2 nonreactive, thyroid was normal at 0.561, she is being referred to [facility] cardiology for her shortness of breath, and coming here for her migraines and pseudotumor cerebri, she was started on pre-Maren vaginal  cream recently for atrophic vaginitis.  Also see above for prior history from review of chart and referral.  Her magnesium  is normal 2.1  There are also notes from [facility] from April 2022, it appears as though she has tried amitriptyline, Maxalt, Topamax, acetazolamide , Imitrex  CT head 05/03/2022: TECHNIQUE: Contiguous axial images were obtained from the base of the skull through the vertex without intravenous contrast.   RADIATION DOSE REDUCTION: This exam was performed according to the departmental dose-optimization program which includes automated exposure control, adjustment of the mA and/or kV according to patient size and/or use of iterative reconstruction technique.   COMPARISON:  None Available.   FINDINGS: Brain: Cerebral volume within normal limits for patient age.   No evidence for acute intracranial hemorrhage. No findings to suggest acute large vessel territory infarct. No mass lesion, midline shift, or mass effect. Ventricles are normal in size without evidence for hydrocephalus. No extra-axial fluid collection identified. Markedly empty sella.   Vascular: No hyperdense vessel identified.   Skull: Scalp soft tissues demonstrate no acute abnormality. Calvarium intact.   Sinuses/Orbits: Globes and orbital soft tissues within normal limits.   Visualized paranasal sinuses are clear. No mastoid effusion.   ASPECTS [facility] Stroke Program Early CT Score)   - Ganglionic level infarction (caudate, lentiform nuclei, internal capsule, insula, M1-M3 cortex): 7   - Supraganglionic infarction (M4-M6 cortex): 3   Total score (0-10 with 10 being normal): 10   IMPRESSION: 1. No acute intracranial abnormality. 2. ASPECTS is 10. 3. Markedly empty sella. While this finding can be incidental in nature and of no significance, this can also be seen in the setting of idiopathic intracranial hypertension.  LP 2015: RESULT:  After explanation of risks,  benefits, and alternatives, the patient was consented for the procedure and signed the consent form. The patient was then placed on the fluoroscopy  table in a prone, slightly obliqued position and the L3-L4 interspace was identified and marked. The site was then prepped and draped in a sterile fashion and 1% lidocaine was used  to anesthetize the site. A 5 inch 22-gauge spinal needle with stylet was introduced into the thecal sac and 10 mL of colorless, transparent fluid was collected and sent for further  laboratory analysis. Opening pressure was calculated at 17 cm water ( manometer reading of five on May 12 cm needle). Closing pressure was calculated at 14 cm water (manometer  reading of two on a 12 cm needle). The patient tolerated the procedure well and there were no initial complications. The patient was instructed to lay supine for 4 hours  immediately after the procedure.   IMPRESSION:  Successful fluoroscopically guided lumbar puncture. 10 mL of clear CSF fluid were obtained and sent for further  laboratory review. Opening pressure 17 cm water. Closing pressure 14  cm water.   Review of Systems: Patient complains of symptoms per HPI as well as the following symptoms headache. Pertinent negatives and positives per HPI. All others negative.   Social History   Socioeconomic History   Marital status: Married    Spouse name: Not on file   Number of children: Not on file   Years of education: Not on file   Highest education level: Not on file  Occupational History   Not on file  Tobacco Use   Smoking status: Never   Smokeless tobacco: Never  Substance and Sexual Activity   Alcohol use: Never   Drug use: Not on file   Sexual activity: Not on file  Other Topics Concern   Not on file  Social History Narrative   Not on file   Social Drivers of Health   Financial Resource Strain: Not on file  Food Insecurity: Not on file  Transportation Needs: Not on file  Physical Activity:  Not on file  Stress: Not on file  Social Connections: Not on file  Intimate Partner Violence: Not on file    Family History  Problem Relation Age of Onset   Migraines Neg Hx     Past Medical History:  Diagnosis Date   Deaf, left    Glaucoma     Patient Active Problem List   Diagnosis Date Noted   Idiopathic intracranial hypotension 11/20/2023   Chronic migraine without aura, with intractable migraine, so stated, with status migrainosus 11/20/2023   Nocturnal hypoxemia 11/20/2023   Chronic migraine without aura without status migrainosus, not intractable 02/28/2023   Glaucoma of both eyes 02/28/2023   History of IIH (idiopathic intracranial hypertension) 02/28/2023    Past Surgical History:  Procedure Laterality Date   CHOLECYSTECTOMY     gastric sleeve     hysterectomy     tubligation       Current Outpatient Medications  Medication Sig Dispense Refill   acetaZOLAMIDE  (DIAMOX ) 250 MG tablet Take 1 tablet (250 mg total) by mouth 2 (two) times daily. If no side effects, in 2 weeks increase to 500mg (2 pills) twice daily. 60 tablet 3   Acetaminophen  (TYLENOL  PO) Take by mouth.     Ascorbic Acid (VITAMIN C PO) Take by mouth.     brimonidine (ALPHAGAN) 0.2 % ophthalmic solution 1 drop 2 (two) times daily.     CALCIUM PO Take by mouth.     Fremanezumab -vfrm (AJOVY ) 225 MG/1.5ML SOAJ Inject 225 mg into the skin every 30 (thirty) days. Please run copay card BIN# [id number] PCN# [id number] GRP# [id number] ID# [id number] EXPIRES: 11/06/2024 1.5 mL 11   latanoprost (XALATAN) 0.005 % ophthalmic solution Apply to eye.     metoprolol  tartrate (LOPRESSOR ) 25 MG tablet Take tablet TWO hours prior to your cardiac CT scan. 1 tablet 0   Multiple Vitamin (MULTIVITAMIN PO) Take by mouth.     pantoprazole (PROTONIX) 40 MG tablet Take 40 mg by mouth daily.     timolol (TIMOPTIC) 0.5 % ophthalmic solution Apply to eye.     Ubrogepant  (UBRELVY ) 100 MG TABS Take 1 tablet (100 mg total) by mouth every 2  (two) hours as needed. Maximum 200mg  a day. 8 tablet 0   VITAMIN D PO Take by mouth.     Current Facility-Administered Medications  Medication Dose Route Frequency Provider Last Rate Last Admin   Fremanezumab -vfrm SOSY 225 mg  225 mg Subcutaneous Once Pacen Watford B,  MD        Allergies as of 11/20/2023 - Review Complete 11/20/2023  Allergen Reaction Noted   Oxycodone Anaphylaxis 05/03/2022    Vitals: There were no vitals taken for this visit. Last Weight:  Wt Readings from Last 1 Encounters:  07/10/23 175 lb (79.4 kg)   Last Height:   Ht Readings from Last 1 Encounters:  07/10/23 5' 7 (1.702 m)    Physical exam: Exam: Gen: NAD, conversant      CV: No palpitations or chest pain or SOB. VS: Breathing at a normal rate. Obese. Not febrile. Eyes: Conjunctivae clear without exudates or hemorrhage  Neuro: Detailed Neurologic Exam  Speech:    Speech is normal; fluent and spontaneous with normal comprehension.  Cognition:    The patient is oriented to person, place, and time;     recent and remote memory intact;     language fluent;     normal attention, concentration, fund of knowledge Cranial Nerves:    The pupils are equal, round, and reactive to light. Visual fields are full Extraocular movements are intact.  The face is symmetric with normal sensation. The palate elevates in the midline. Hearing intact. Voice is normal. Shoulder shrug is normal. The tongue has normal motion without fasciculations.   Coordination: normal  Gait:    No abnormalities noted or reported  Motor Observation:   no involuntary movements noted. Tone:    Appears normal  Posture:    Posture is normal. normal erect    Strength:    Strength is anti-gravity and symmetric in the upper and lower limbs.      Sensation: intact to LT, no reports of numbness or tingling or paresthesias          Assessment/Plan:  Patient with chronic migraine. Was worked up in [place]  and diagnosed with  IDIOPATHIC INTRACRANIAL HYPERTENSION due to , MRI brain showed Expanded sella and no pituitary mass. Optic nerve sheath complex is distended and tortuous on both sides. Findings are suggestive of idiopathic intracranial hypertension al though reports I see from there show opening pressure of 17cm, and no obvious field cuts or papilledema. Was on acetazolamide  for a while without significant benefit. But she was in the hospital for 3-4 days and had 3 LPs per patient so maybe we don;t have the whole story.  - MRI showed markedly enlarged sella turcica consistent with a empty sella. MRI orbits showed: The orbits are essentially normal. The optic nerve sheaths were mildly widened which can be incidental or due to elevated intracranial pressure. Saw her 02/28/2023.  - ONO showed Considerations noted on test: It appears this patient qualifies for nocturnal oxygen  per medicare guidelines referral to sleep studies also has morning headaches and obesity - Patient was called and then sent a letter to schedule LP, she scheduled in 05/2023 and couldn't make it. - At this time would prefer to have had an LP but will start to treat with acetazolamide  - get [facility] notes - walked ot the front desk and got her a follow up in 8 weeks explained its very important not to lose her to follow up -Weight loss is critical, discussed weight loss and risk of permanent vision loss - reorder Ajovy , filled out copay card for patient with her permission and placed in prescription. Daily migraines. Feels Ajovy  is helping  Orders Placed This Encounter  Procedures   Ambulatory referral to Sleep Studies   Meds ordered this encounter  Medications   acetaZOLAMIDE  (  DIAMOX ) 250 MG tablet    Sig: Take 1 tablet (250 mg total) by mouth 2 (two) times daily. If no side effects, in 2 weeks increase to 500mg (2 pills) twice daily.    Dispense:  60 tablet    Refill:  3   Fremanezumab -vfrm (AJOVY ) 225 MG/1.5ML SOAJ    Sig: Inject  225 mg into the skin every 30 (thirty) days. Please run copay card BIN# [id number] PCN# [id number] GRP# [id number] ID# [id number] EXPIRES: 11/06/2024    Dispense:  1.5 mL    Refill:  11    Please run copay card BIN# [id number] PCN# [id number] GRP# [id number] ID# [id number] EXPIRES: 11/06/2024      For any acute change especially worsening headache or vision loss call 911 and proceed to ED  To prevent or relieve headaches, try the following: Cool Compress. Lie down and place a cool compress on your head.  Avoid headache triggers. If certain foods or odors seem to have triggered your migraines in the past, avoid them. A headache diary might help you identify triggers.  Include physical activity in your daily routine. Try a daily walk or other moderate aerobic exercise.  Manage stress. Find healthy ways to cope with the stressors, such as delegating tasks on your to-do list.  Practice relaxation techniques. Try deep breathing, yoga, massage and visualization.  Eat regularly. Eating regularly scheduled meals and maintaining a healthy diet might help prevent headaches. Also, drink plenty of fluids.  Follow a regular sleep schedule. Sleep deprivation might contribute to headaches Consider biofeedback. With this mind-body technique, you learn to control certain bodily functions -- such as muscle tension, heart rate and blood pressure -- to prevent headaches or reduce headache pain.    Proceed to emergency room if you experience new or worsening symptoms or symptoms do not resolve, if you have new neurologic symptoms or if headache is severe, or for any concerning symptom.   Provided education and documentation from American headache Society toolbox including articles on: pseudotumoer cerebri(IIH), chronic migraine medication overuse headache, chronic migraines, prevention of migraines and other headaches, behavioral and other nonpharmacologic treatments for headache.   Orders Placed This Encounter  Procedures    Ambulatory referral to Sleep Studies   Meds ordered this encounter  Medications   acetaZOLAMIDE  (DIAMOX ) 250 MG tablet    Sig: Take 1 tablet (250 mg total) by mouth 2 (two) times daily. If no side effects, in 2 weeks increase to 500mg (2 pills) twice daily.    Dispense:  60 tablet    Refill:  3   Fremanezumab -vfrm (AJOVY ) 225 MG/1.5ML SOAJ    Sig: Inject 225 mg into the skin every 30 (thirty) days. Please run copay card BIN# [id number] PCN# [id number] GRP# [id number] ID# [id number] EXPIRES: 11/06/2024    Dispense:  1.5 mL    Refill:  11    Please run copay card BIN# [id number] PCN# [id number] GRP# [id number] ID# [id number] EXPIRES: 11/06/2024    Cc: Claudene Round, MD,  Claudene Round, MD  Jill Epp, MD  [facility] [address] [place], [place] [address]  Phone [phone] Fax [phone]

## 2024-01-12 ENCOUNTER — Encounter: Payer: Self-pay | Admitting: Neurology

## 2024-01-12 ENCOUNTER — Ambulatory Visit: Payer: No Typology Code available for payment source | Admitting: Neurology

## 2024-01-12 VITALS — BP 111/68 | HR 70 | Ht 67.0 in | Wt 172.0 lb

## 2024-01-12 DIAGNOSIS — G43711 Chronic migraine without aura, intractable, with status migrainosus: Secondary | ICD-10-CM

## 2024-01-12 DIAGNOSIS — G4734 Idiopathic sleep related nonobstructive alveolar hypoventilation: Secondary | ICD-10-CM

## 2024-01-12 DIAGNOSIS — G43709 Chronic migraine without aura, not intractable, without status migrainosus: Secondary | ICD-10-CM | POA: Diagnosis not present

## 2024-01-12 DIAGNOSIS — H4010X1 Unspecified open-angle glaucoma, mild stage: Secondary | ICD-10-CM

## 2024-01-12 DIAGNOSIS — G932 Benign intracranial hypertension: Secondary | ICD-10-CM | POA: Diagnosis not present

## 2024-01-12 NOTE — Progress Notes (Signed)
 SLEEP MEDICINE CLINIC    Provider:  Melvyn Novas, MD  Primary Care Physician:  Caffie Damme, MD [address] [facility] [place] [address]     Referring Provider: Anson Fret, Md [address] [place],  [place] [address]          Chief Complaint according to patient   Patient presents with:     New Patient (Initial Visit)     Pt is well, report she has no concerns with her sleep. She gets at least 8 hrs of sleep a night. She does wake up with morning headaches. No known apnea       HISTORY OF PRESENT ILLNESS:  Jill Browning is a 53 y.o. female patient who is seen upon referral on 01/12/2024 from Dr Lucia Gaskins to rule out OSA as contributing factors of elevated intracranial pressure.    Chief concern according to patient : " I wake up with headaches and headaches wake me up".  My morning headaches have decreased in frequency since Dr Lucia Gaskins introduced Diamox and Ajovy.   I have the pleasure of seeing [name] on 01/12/24 a  right -handed female with a possible sleep disorder.    Sleep relevant medical history:  Nocturia  2 times, no Parasomnia , Tonsillectomy at age 23, no concussions, no whiplash, rhinitis ( ever since she moved here from [facility]). Family medical /sleep history: No other family member on CPAP with OSA,  parents are alive: father has cancer in chemotherapy, Mother has cardiomyopathy and DM, HTN, Asthma.    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   Adult children in [place], 7  grandchildren.  The patient currently works. Tobacco use; none.  ETOH use ; none ,   vegetarian, pescatarian- Caffeine I; none .  Regular exercise,  walking.  Hobbies : reading , audio books.    Sleep habits are as follows: The patient's dinner time is between 5.30 PM- 6. The patient goes to bed at 7.30  PM and  falls asleep listening to audio books- continues to sleep for 8 hours, wakes for  1-3 bathroom breaks, the first time at 2-3 AM.   The preferred sleep position is on both sides, with the support of 2 pillows.  Flat bed. Bedroom is cool, quiet and dark.  Dreams are reportedly frequent/vivid.  The patient wakes up spontaneously/ 5  AM is the usual rise time. This is entrained for many years.  Certain weather changes have increased her morning headaches in frequency and intensity. History of migraines.   She reports feeling refreshed  and restored in AM, but has  symptoms such as dry mouth, morning headaches, and residual fatigue.  Naps are  not taken -less refreshing and interfering nocturnal sleep.    Review of Systems: Out of a complete 14 system review, the patient complains of only the following symptoms, and all other reviewed systems are negative.:  Fatigue, sleepiness , snoring, fragmented sleep, Insomnia, RLS, Nocturia    How likely are you to doze in the following situations: 0 = not likely, 1 = slight chance, 2 = moderate chance, 3 = high chance   Sitting and Reading? Watching Television? Sitting inactive in a public place (theater or meeting)? As a passenger in a car for an hour without a break? Lying down in the afternoon when circumstances permit? Sitting and talking to someone?  Sitting quietly after lunch without alcohol? In a car, while stopped for a few minutes in traffic?   Total = 5/ 24 points   FSS endorsed at 16/ 63 points.    Social History   Socioeconomic History   Marital status: Married    Spouse name: Not on file   Number of children: Not on file   Years of education: Not on file   Highest education level: Not on file  Occupational History   Not on file  Tobacco Use   Smoking status: Never   Smokeless tobacco: Never  Substance and Sexual Activity   Alcohol use: Never   Drug use: Not on file   Sexual activity: Not on file  Other Topics Concern   Not on file  Social History Narrative   Not on file   Social Drivers of Health    Financial Resource Strain: Not on file  Food Insecurity: Not on file  Transportation Needs: Not on file  Physical Activity: Not on file  Stress: Not on file  Social Connections: Not on file    Family History  Problem Relation Age of Onset   Migraines Neg Hx     Past Medical History:  Diagnosis Date   Deaf, left    Glaucoma     Past Surgical History:  Procedure Laterality Date   CHOLECYSTECTOMY     gastric sleeve     hysterectomy     tubligation        Current Outpatient Medications on File Prior to Visit  Medication Sig Dispense Refill   Acetaminophen (TYLENOL PO) Take by mouth.     acetaZOLAMIDE (DIAMOX) 250 MG tablet Take 1 tablet (250 mg total) by mouth 2 (two) times daily. If no side effects, in 2 weeks increase to 500mg (2 pills) twice daily. 60 tablet 3   Ascorbic Acid (VITAMIN C PO) Take by mouth.     brimonidine (ALPHAGAN) 0.2 % ophthalmic solution 1 drop 2 (two) times daily.     CALCIUM PO Take by mouth.     Fremanezumab-vfrm (AJOVY) 225 MG/1.5ML SOAJ Inject 225 mg into the skin every 30 (thirty) days. Please run copay card BIN# [id number] PCN# [id number] GRP# [id number] ID# [id number] EXPIRES: 11/06/2024 1.5 mL 11   latanoprost (XALATAN) 0.005 % ophthalmic solution Apply to eye.     metoprolol tartrate (LOPRESSOR) 25 MG tablet Take tablet TWO hours prior to your cardiac CT scan. 1 tablet 0   Multiple Vitamin (MULTIVITAMIN PO) Take by mouth.     pantoprazole (PROTONIX) 40 MG tablet Take 40 mg by mouth daily.     timolol (TIMOPTIC) 0.5 % ophthalmic solution Apply to eye.     Ubrogepant (UBRELVY) 100 MG TABS Take 1 tablet (100 mg total) by mouth every 2 (two) hours as needed. Maximum 200mg  a day. 8 tablet 0   VITAMIN D PO Take by mouth.     Current Facility-Administered Medications on File Prior to Visit  Medication Dose Route Frequency Provider Last Rate Last Admin   Fremanezumab-vfrm SOSY 225 mg  225 mg Subcutaneous Once Anson Fret, MD        Allergies  Allergen  Reactions   Oxycodone Anaphylaxis     DIAGNOSTIC DATA (LABS, IMAGING, TESTING) - I reviewed patient records, labs, notes, testing and imaging myself where available.  Lab Results  Component Value Date   WBC 7.1 05/03/2022   HGB 13.4 05/03/2022   HCT 41.9 05/03/2022   MCV 90.3 05/03/2022   PLT 239  05/03/2022      Component Value Date/Time   NA 139 05/03/2022 1926   K 4.7 05/03/2022 1926   CL 106 05/03/2022 1926   CO2 24 05/03/2022 1926   GLUCOSE 85 05/03/2022 1926   BUN 12 05/03/2022 1926   CREATININE 0.68 05/03/2022 1926   CALCIUM 9.9 05/03/2022 1926   PROT 8.1 05/03/2022 1926   ALBUMIN 4.6 05/03/2022 1926   AST 28 05/03/2022 1926   ALT 12 05/03/2022 1926   ALKPHOS 68 05/03/2022 1926   BILITOT 0.4 05/03/2022 1926   GFRNONAA >60 05/03/2022 1926   No results found for: "CHOL", "HDL", "LDLCALC", "LDLDIRECT", "TRIG", "CHOLHDL" No results found for: "HGBA1C" Lab Results  Component Value Date   VITAMINB12 1,462 (H) 05/03/2022   No results found for: "TSH"  PHYSICAL EXAM:  Today's Vitals   01/12/24 0957  BP: 111/68  Pulse: 70  Weight: 172 lb (78 kg)  Height: 5\' 7"  (1.702 m)   Body mass index is 26.94 kg/m.   Wt Readings from Last 3 Encounters:  01/12/24 172 lb (78 kg)  07/10/23 175 lb (79.4 kg)  02/28/23 175 lb 12.8 oz (79.7 kg)     Ht Readings from Last 3 Encounters:  01/12/24 5\' 7"  (1.702 m)  07/10/23 5\' 7"  (1.702 m)  02/28/23 5\' 7"  (1.702 m)      General: The patient is awake, alert and appears not in acute distress. The patient is well groomed. Head: Normocephalic, atraumatic.  Neck is supple. Mallampati 2,  neck circumference:13 inches . Nasal airflow is patent.  Retrognathia is seen.  Overbite  Dental status: biological  Cardiovascular:  Regular rate and cardiac rhythm by pulse,  without distended neck veins. Respiratory: Lungs are clear to auscultation.  Skin:  Without evidence of ankle edema, or rash. Trunk: The patient's posture is erect.    NEUROLOGIC EXAM: The patient is awake and alert, oriented to place and time.   Memory subjective described as intact.  Attention span & concentration ability appears normal.  Speech is fluent,  without  dysarthria, dysphonia or aphasia.  Mood and affect are appropriate.   Cranial nerves: no loss of smell or taste reported  Pupils are equal and briskly reactive to light. Funduscopic exam deferred.  Extraocular movements in vertical and horizontal planes were intact and without nystagmus. No Diplopia. Visual fields by finger perimetry are intact. Hearing was intact to soft voice and finger rubbing.    Facial sensation intact to fine touch.  Facial motor strength is symmetric and tongue and uvula move midline.  Neck ROM : rotation, tilt and flexion extension were normal for age and shoulder shrug was symmetrical.    Motor exam:  Symmetric bulk, tone and ROM.   Normal tone without cog- wheeling, symmetric grip strength .   Sensory:  Fine touch, pinprick and vibration were tested  and  normal.  Proprioception tested in the upper extremities was normal.   Coordination: Rapid alternating movements in the fingers/hands were of normal speed.  The Finger-to-nose maneuver was intact without evidence of ataxia, dysmetria or tremor.   Gait and station: Patient could rise unassisted from a seated position, walked without assistive device.  Stance is of normal width/ base and the patient turned with 3 steps.  Toe and heel walk were deferred.  Deep tendon reflexes: in the  upper and lower extremities are symmetric and intact.  Babinski response was deferred    ASSESSMENT AND PLAN 53 y.o. year old female  here with:  Status post bariatric surgery-  she lost  weight , pre surgery she was at  262 lbs, now 172.   Idiopathic Intracranial Hypertension , glaucoma.     1) moring headaches and headaches that can wake her out of sleep, history of migraines.  No EDS and no high fatigue score or  fragmentation of sleep. Husband  witnessed only occasional snoring, no apnea.   2) the only physical risk factor for OSA is overbite and retrognathia. Not her low BMI .    I will order a HST.  Tis will help to determine  if apnea is present  , level  of snoring , positional  changes, hypoxia.   I plan to follow up either personally or through our NP within 3 months if HST is positive.   I would like to thank Anson Fret, Md [address] [place],  [place] [address] for allowing me to meet with and to take care of this pleasant patient.    After spending a total time of  35  minutes face to face and additional time for physical and neurologic examination, review of laboratory studies,  personal review of imaging studies, reports and results of other testing and review of referral information / records as far as provided in visit,   Electronically signed by: Melvyn Novas, MD 01/12/2024 10:20 AM  [facility] Board certified by The [employer] of Sleep Medicine and Diplomate of the [employer] of Sleep Medicine. Board certified In Neurology through the ABPN, Fellow of the [employer] of Neurology.

## 2024-01-18 ENCOUNTER — Telehealth: Payer: Self-pay | Admitting: Neurology

## 2024-01-18 NOTE — Telephone Encounter (Signed)
 MAIL OUT  HST [employer] no auth req   Patient is on the schedule for 01/24/24

## 2024-01-24 ENCOUNTER — Telehealth: Payer: Self-pay | Admitting: Neurology

## 2024-01-24 ENCOUNTER — Ambulatory Visit: Admitting: Neurology

## 2024-01-24 ENCOUNTER — Ambulatory Visit: Payer: No Typology Code available for payment source | Admitting: Neurology

## 2024-01-24 DIAGNOSIS — G43709 Chronic migraine without aura, not intractable, without status migrainosus: Secondary | ICD-10-CM

## 2024-01-24 DIAGNOSIS — G932 Benign intracranial hypertension: Secondary | ICD-10-CM

## 2024-01-24 DIAGNOSIS — H4010X1 Unspecified open-angle glaucoma, mild stage: Secondary | ICD-10-CM

## 2024-01-24 DIAGNOSIS — G43711 Chronic migraine without aura, intractable, with status migrainosus: Secondary | ICD-10-CM

## 2024-01-24 DIAGNOSIS — G4733 Obstructive sleep apnea (adult) (pediatric): Secondary | ICD-10-CM | POA: Diagnosis not present

## 2024-01-24 DIAGNOSIS — G4734 Idiopathic sleep related nonobstructive alveolar hypoventilation: Secondary | ICD-10-CM

## 2024-01-24 NOTE — Telephone Encounter (Signed)
 I spoke with this patient to reschedule her appointment on 3/19 due to MD being out. She was added on to Dr. Trevor Mace schedule at 3:30 and the appointment note says she can be as late as 5pm. Is there another day we could add her on?

## 2024-01-24 NOTE — Telephone Encounter (Signed)
 LVM and sent mychart msg offering appointment with Dr. Lucia Gaskins for 02/01/24

## 2024-01-24 NOTE — Telephone Encounter (Signed)
 Apologize for me. March 27 at 330. Also if there are any spots held on the wed 26th or thurs 27th they can be used as I am not traveling that weekend now thanks

## 2024-02-07 NOTE — Progress Notes (Signed)
 [facility]   HOME SLEEP TEST REPORT ( mail out test-by Watch PAT)   STUDY DATE:  device issued on 01-27-2024, data loaded on 02-07-2024.     ORDERING CLINICIAN: Neomia Banner, MD  REFERRING CLINICIANTresia Fruit, MD    CLINICAL INFORMATION/HISTORY: Jill Browning is a 53 y.o. female patient who is seen upon referral on 01/12/2024 from Dr Tresia Fruit to rule out OSA as contributing factors of elevated intracranial pressure.  History of allergic rhinitis, nocturia x 2, waking up with a dry mouth.  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     Chief concern according to patient : " I wake up with headaches and headaches wake me up".   "My morning headaches have decreased in frequency since Dr Tresia Fruit introduced Diamox  and Ajovy .".        Epworth sleepiness score:  5/ 24 points   FSS endorsed at 16/ 63 points.        BMI: kg/m 26.9    Neck Circumference: 13 inches   FINDINGS:   Sleep Summary:   Total Recording Time (hours, min):     8 hours 52 minutes   Total Sleep Time (hours, min):     8 hours 13 minutes            Percent REM (%):     31%                                   Respiratory Indices:   Calculated pAHI (per hour):     Following AASM criteria the AHI was only 5.6/h which is very mild.                        REM pAHI: 7.9/h                                               NREM pAHI: 4.6/h                            Supine AHI:   Positional apnea hypopnea index varied minimally between a supine AHI at 6.3, and a nonsupine of 4.2/h.  Snoring reached a mean volume of 41 dB which would be just above the threshold of detection.  Snoring was recorded for approximately 17% of total recorded sleep time.  There were only 4 minutes of loud snoring captured.                                                 Oxygen Saturation Statistics:   Oxygen Saturation (%) Mean:   95%            O2 Saturation Range (%):    Between 87 and 100%  O2 Saturation (minutes) <89%:      0 minutes  Pulse Rate Statistics:   Pulse Mean (bpm):      66 bpm           Pulse Range:               Between 53 and 105 beats per minute, without information about cardiac rhythm.   IMPRESSION:  This HST confirms the presence of very mild sleep apnea that is not REM sleep dependent and not associated with hypoxia.  I debated if this apnea should be treated at all.  This patient has excellent high REM sleep proportions, she woke up 4 times during the night each time very briefly.  Treatment #1 is: avoiding supine sleep.  Treatment #2 is continuation of reduction of intracranial hypertension with medications but also an addition of an allergy medication: I recommend daily Allegra as a nondrowsy making seasonal medication for this patient, also a saline nasal spray to prevent rhinitis from becoming sinusitis and therefore another source of headache.      RECOMMENDATION: I feel strongly that if the patient can avoid sleeping on her back and would use a wedge pillow to elevate her upper body during sleep, she could avoid any positive airway pressure therapy.   INTERPRETING PHYSICIAN:   [facility] [employer] certified by [employer] of Sleep Medicine and Diplomate of the [employer] of Sleep Medicine. Board certified In Neurology through the ABPN, Fellow of the [employer] of Neurology.    Neomia Banner, MD

## 2024-02-27 ENCOUNTER — Encounter: Payer: Self-pay | Admitting: Neurology

## 2024-02-27 NOTE — Procedures (Signed)
 [facility]   HOME SLEEP TEST REPORT ( mail out test-by Watch PAT)   STUDY DATE:  device issued on 01-27-2024, data loaded on 02-07-2024.     ORDERING CLINICIAN: Neomia Banner, MD  REFERRING CLINICIANTresia Fruit, MD    CLINICAL INFORMATION/HISTORY: Jill Browning is a 53 y.o. female patient who is seen upon referral on 01/12/2024 from Dr Tresia Fruit to rule out OSA as contributing factors of elevated intracranial pressure.  History of allergic rhinitis, nocturia x 2, waking up with a dry mouth.  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     Chief concern according to patient : " I wake up with headaches and headaches wake me up".   "My morning headaches have decreased in frequency since Dr Tresia Fruit introduced Diamox  and Ajovy .".        Epworth sleepiness score:  5/ 24 points   FSS endorsed at 16/ 63 points.        BMI: kg/m 26.9    Neck Circumference: 13 inches   FINDINGS:   Sleep Summary:   Total Recording Time (hours, min):     8 hours 52 minutes   Total Sleep Time (hours, min):     8 hours 13 minutes            Percent REM (%):     31%                                   Respiratory Indices:   Calculated pAHI (per hour):     Following AASM criteria the AHI was only 5.6/h which is very mild.                        REM pAHI: 7.9/h                                               NREM pAHI: 4.6/h                            Supine AHI:   Positional apnea hypopnea index varied minimally between a supine AHI at 6.3, and a nonsupine of 4.2/h.  Snoring reached a mean volume of 41 dB which would be just above the threshold of detection.  Snoring was recorded for approximately 17% of total recorded sleep time.  There were only 4 minutes of loud snoring captured.                                                 Oxygen Saturation Statistics:   Oxygen Saturation (%) Mean:   95%            O2 Saturation Range (%):    Between 87 and 100%  O2 Saturation (minutes) <89%:      0 minutes  Pulse Rate Statistics:   Pulse Mean (bpm):      66 bpm           Pulse Range:               Between 53 and 105 beats per minute, without information about cardiac rhythm.   IMPRESSION:  This HST confirms the presence of very mild sleep apnea that is not REM sleep dependent and not associated with hypoxia.  I debated if this apnea should be treated at all.  This patient has excellent high REM sleep proportions, she woke up 4 times during the night each time very briefly.  Treatment #1 is: avoiding supine sleep.  Treatment #2 is continuation of reduction of intracranial hypertension with medications but also an addition of an allergy medication: I recommend daily Allegra as a nondrowsy making seasonal medication for this patient, also a saline nasal spray to prevent rhinitis from becoming sinusitis and therefore another source of headache.      RECOMMENDATION: I feel strongly that if the patient can avoid sleeping on her back and would use a wedge pillow to elevate her upper body during sleep, she could avoid any positive airway pressure therapy.   INTERPRETING PHYSICIAN:   [facility] [employer] certified by [employer] of Sleep Medicine and Diplomate of the [employer] of Sleep Medicine. Board certified In Neurology through the ABPN, Fellow of the [employer] of Neurology.    Neomia Banner, MD

## 2024-02-28 NOTE — Telephone Encounter (Signed)
 Jill Browning

## 2024-02-28 NOTE — Telephone Encounter (Signed)
-----   Message from [place] Dohmeier sent at 02/27/2024 11:11 PM EDT ----- This patients apnea is so mild that I wouldn't recommend CPAP but  avoiding supine sleep and using an elevating wedge pillow for better air movement.  Saline Nasal Spray and use allergy medications are recommended.

## 2024-02-28 NOTE — Telephone Encounter (Signed)
Called patient to discuss sleep study results. No answer at this time. LVM for the patient to call back.  Will sent a mychart message as well.

## 2024-03-05 NOTE — Telephone Encounter (Signed)
 Called pt at [phone]. LVM for her to call about results.

## 2024-06-27 ENCOUNTER — Other Ambulatory Visit: Payer: Self-pay | Admitting: Family Medicine

## 2024-06-27 DIAGNOSIS — Z1231 Encounter for screening mammogram for malignant neoplasm of breast: Secondary | ICD-10-CM

## 2024-06-28 ENCOUNTER — Other Ambulatory Visit: Payer: Self-pay | Admitting: Neurology

## 2024-07-04 ENCOUNTER — Ambulatory Visit
Admission: RE | Admit: 2024-07-04 | Discharge: 2024-07-04 | Disposition: A | Discharge status: Home / Self Care | Source: Ambulatory Visit | Attending: Family Medicine | Admitting: Family Medicine

## 2024-07-04 DIAGNOSIS — Z1231 Encounter for screening mammogram for malignant neoplasm of breast: Secondary | ICD-10-CM

## 2024-12-02 ENCOUNTER — Ambulatory Visit: Admitting: Neurology

## 2024-12-10 ENCOUNTER — Ambulatory Visit: Admitting: Neurology

## 2025-03-25 ENCOUNTER — Ambulatory Visit: Admitting: Neurology
